# Patient Record
Sex: Female | Born: 2005 | Race: White | Hispanic: No | Marital: Single | Smoking: Never smoker
Health system: Southern US, Community
[De-identification: ages and names within clinical notes are randomized; demographics above are authoritative.]

## PROBLEM LIST (undated history)

## (undated) DIAGNOSIS — J302 Other seasonal allergic rhinitis: Secondary | ICD-10-CM

## (undated) DIAGNOSIS — F5000 Anorexia nervosa, unspecified: Secondary | ICD-10-CM

## (undated) DIAGNOSIS — F32A Depression, unspecified: Secondary | ICD-10-CM

## (undated) DIAGNOSIS — F419 Anxiety disorder, unspecified: Secondary | ICD-10-CM

## (undated) DIAGNOSIS — F952 Tourette's disorder: Secondary | ICD-10-CM

## (undated) DIAGNOSIS — F445 Conversion disorder with seizures or convulsions: Secondary | ICD-10-CM

## (undated) DIAGNOSIS — F4310 Post-traumatic stress disorder, unspecified: Secondary | ICD-10-CM

## (undated) HISTORY — PX: TOE SURGERY: SHX1073

## (undated) HISTORY — DX: Anorexia nervosa, unspecified: F50.00

## (undated) HISTORY — DX: Anxiety disorder, unspecified: F41.9

## (undated) HISTORY — DX: Conversion disorder with seizures or convulsions: F44.5

## (undated) HISTORY — DX: Post-traumatic stress disorder, unspecified: F43.10

## (undated) HISTORY — DX: Depression, unspecified: F32.A

---
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
## 2024-01-04 DIAGNOSIS — F445 Conversion disorder with seizures or convulsions: Secondary | ICD-10-CM | POA: Diagnosis not present

## 2024-01-04 DIAGNOSIS — F4310 Post-traumatic stress disorder, unspecified: Secondary | ICD-10-CM | POA: Diagnosis not present

## 2024-01-10 ENCOUNTER — Encounter: Payer: Medicaid Other | Attending: Physical Medicine & Rehabilitation | Admitting: Physical Medicine & Rehabilitation

## 2024-01-10 DIAGNOSIS — F332 Major depressive disorder, recurrent severe without psychotic features: Secondary | ICD-10-CM | POA: Insufficient documentation

## 2024-01-10 DIAGNOSIS — M357 Hypermobility syndrome: Secondary | ICD-10-CM | POA: Insufficient documentation

## 2024-01-10 DIAGNOSIS — M5441 Lumbago with sciatica, right side: Secondary | ICD-10-CM | POA: Insufficient documentation

## 2024-01-10 DIAGNOSIS — M5442 Lumbago with sciatica, left side: Secondary | ICD-10-CM | POA: Insufficient documentation

## 2024-01-10 DIAGNOSIS — G8929 Other chronic pain: Secondary | ICD-10-CM | POA: Insufficient documentation

## 2024-01-11 DIAGNOSIS — F4310 Post-traumatic stress disorder, unspecified: Secondary | ICD-10-CM | POA: Diagnosis not present

## 2024-01-11 DIAGNOSIS — F445 Conversion disorder with seizures or convulsions: Secondary | ICD-10-CM | POA: Diagnosis not present
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
## 2024-01-12 ENCOUNTER — Encounter (HOSPITAL_COMMUNITY): Payer: Self-pay | Admitting: Psychiatry

## 2024-01-12 ENCOUNTER — Ambulatory Visit (HOSPITAL_COMMUNITY): Payer: Medicaid Other | Admitting: Psychiatry

## 2024-01-12 DIAGNOSIS — F4310 Post-traumatic stress disorder, unspecified: Secondary | ICD-10-CM

## 2024-01-12 DIAGNOSIS — F50014 Anorexia nervosa, restricting type, in remission: Secondary | ICD-10-CM

## 2024-01-12 DIAGNOSIS — F339 Major depressive disorder, recurrent, unspecified: Secondary | ICD-10-CM

## 2024-01-12 DIAGNOSIS — F411 Generalized anxiety disorder: Secondary | ICD-10-CM | POA: Diagnosis not present

## 2024-01-12 MED ORDER — DULOXETINE HCL 60 MG PO CPEP: 60.0000 mg | ORAL_CAPSULE | Freq: Every day | ORAL | 2 refills | Status: DC

## 2024-01-12 MED ORDER — PROPRANOLOL HCL ER 60 MG PO CP24: 60.0000 mg | ORAL_CAPSULE | Freq: Every day | ORAL | 2 refills | Status: DC

## 2024-01-12 NOTE — Patient Instructions (Signed)

## 2024-01-18 DIAGNOSIS — F445 Conversion disorder with seizures or convulsions: Secondary | ICD-10-CM | POA: Diagnosis not present

## 2024-01-18 DIAGNOSIS — F4310 Post-traumatic stress disorder, unspecified: Secondary | ICD-10-CM | POA: Diagnosis not present

## 2024-01-25 DIAGNOSIS — F445 Conversion disorder with seizures or convulsions: Secondary | ICD-10-CM | POA: Diagnosis not present

## 2024-01-25 DIAGNOSIS — F4310 Post-traumatic stress disorder, unspecified: Secondary | ICD-10-CM | POA: Diagnosis not present

## 2024-02-01 DIAGNOSIS — F4310 Post-traumatic stress disorder, unspecified: Secondary | ICD-10-CM | POA: Diagnosis not present

## 2024-02-01 DIAGNOSIS — F445 Conversion disorder with seizures or convulsions: Secondary | ICD-10-CM | POA: Diagnosis not present

## 2024-02-08 DIAGNOSIS — F4310 Post-traumatic stress disorder, unspecified: Secondary | ICD-10-CM | POA: Diagnosis not present

## 2024-02-08 DIAGNOSIS — F445 Conversion disorder with seizures or convulsions: Secondary | ICD-10-CM | POA: Diagnosis not present

## 2024-02-15 DIAGNOSIS — F445 Conversion disorder with seizures or convulsions: Secondary | ICD-10-CM | POA: Diagnosis not present

## 2024-02-15 DIAGNOSIS — F4310 Post-traumatic stress disorder, unspecified: Secondary | ICD-10-CM | POA: Diagnosis not present
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
## 2024-02-22 DIAGNOSIS — F445 Conversion disorder with seizures or convulsions: Secondary | ICD-10-CM | POA: Diagnosis not present

## 2024-02-22 DIAGNOSIS — F4310 Post-traumatic stress disorder, unspecified: Secondary | ICD-10-CM | POA: Diagnosis not present

## 2024-02-23 ENCOUNTER — Telehealth (HOSPITAL_COMMUNITY): Payer: Medicaid Other | Admitting: Psychiatry

## 2024-02-23 ENCOUNTER — Encounter (HOSPITAL_COMMUNITY): Payer: Self-pay | Admitting: Psychiatry

## 2024-02-23 DIAGNOSIS — F339 Major depressive disorder, recurrent, unspecified: Secondary | ICD-10-CM | POA: Diagnosis not present

## 2024-02-23 DIAGNOSIS — F50014 Anorexia nervosa, restricting type, in remission: Secondary | ICD-10-CM

## 2024-02-23 DIAGNOSIS — F411 Generalized anxiety disorder: Secondary | ICD-10-CM | POA: Diagnosis not present

## 2024-02-23 DIAGNOSIS — R41840 Attention and concentration deficit: Secondary | ICD-10-CM

## 2024-02-23 MED ORDER — PROPRANOLOL HCL ER 60 MG PO CP24: 60.0000 mg | ORAL_CAPSULE | Freq: Every day | ORAL | 2 refills | Status: DC

## 2024-02-23 MED ORDER — DULOXETINE HCL 60 MG PO CPEP: 60.0000 mg | ORAL_CAPSULE | Freq: Every day | ORAL | 2 refills | Status: DC

## 2024-02-23 NOTE — Patient Instructions (Signed)

## 2024-02-29 DIAGNOSIS — F4310 Post-traumatic stress disorder, unspecified: Secondary | ICD-10-CM | POA: Diagnosis not present

## 2024-02-29 DIAGNOSIS — F445 Conversion disorder with seizures or convulsions: Secondary | ICD-10-CM | POA: Diagnosis not present

## 2024-03-06 ENCOUNTER — Other Ambulatory Visit (HOSPITAL_COMMUNITY)

## 2024-03-07 DIAGNOSIS — F4310 Post-traumatic stress disorder, unspecified: Secondary | ICD-10-CM | POA: Diagnosis not present

## 2024-03-07 DIAGNOSIS — F445 Conversion disorder with seizures or convulsions: Secondary | ICD-10-CM | POA: Diagnosis not present

## 2024-03-13 ENCOUNTER — Other Ambulatory Visit (INDEPENDENT_AMBULATORY_CARE_PROVIDER_SITE_OTHER)

## 2024-03-13 DIAGNOSIS — R41840 Attention and concentration deficit: Secondary | ICD-10-CM | POA: Diagnosis not present

## 2024-03-13 DIAGNOSIS — Z79899 Other long term (current) drug therapy: Secondary | ICD-10-CM | POA: Diagnosis not present

## 2024-03-13 DIAGNOSIS — F411 Generalized anxiety disorder: Secondary | ICD-10-CM

## 2024-03-13 DIAGNOSIS — F50014 Anorexia nervosa, restricting type, in remission: Secondary | ICD-10-CM

## 2024-03-13 DIAGNOSIS — F339 Major depressive disorder, recurrent, unspecified: Secondary | ICD-10-CM

## 2024-03-13 NOTE — Progress Notes (Signed)
 Pt tolerated lab draws well in right arm with no complaints.   JNL, CMA

## 2024-03-14 DIAGNOSIS — F4310 Post-traumatic stress disorder, unspecified: Secondary | ICD-10-CM | POA: Diagnosis not present

## 2024-03-14 DIAGNOSIS — F445 Conversion disorder with seizures or convulsions: Secondary | ICD-10-CM | POA: Diagnosis not present

## 2024-03-15 LAB — CBC WITH DIFFERENTIAL/PLATELET
Basophils Absolute: 0 10*3/uL (ref 0.0–0.2)
Basos: 0 %
EOS (ABSOLUTE): 0.1 10*3/uL (ref 0.0–0.4)
Eos: 2 %
Hematocrit: 37.7 % (ref 34.0–46.6)
Hemoglobin: 12.2 g/dL (ref 11.1–15.9)
Immature Grans (Abs): 0 10*3/uL (ref 0.0–0.1)
Immature Granulocytes: 0 %
Lymphocytes Absolute: 2.2 10*3/uL (ref 0.7–3.1)
Lymphs: 26 %
MCH: 28.4 pg (ref 26.6–33.0)
MCHC: 32.4 g/dL (ref 31.5–35.7)
MCV: 88 fL (ref 79–97)
Monocytes Absolute: 0.5 10*3/uL (ref 0.1–0.9)
Monocytes: 6 %
Neutrophils Absolute: 5.7 10*3/uL (ref 1.4–7.0)
Neutrophils: 66 %
Platelets: 367 10*3/uL (ref 150–450)
RBC: 4.29 x10E6/uL (ref 3.77–5.28)
RDW: 13.1 % (ref 11.7–15.4)
WBC: 8.6 10*3/uL (ref 3.4–10.8)

## 2024-03-15 LAB — VITAMIN D 25 HYDROXY (VIT D DEFICIENCY, FRACTURES): Vit D, 25-Hydroxy: 18.8 ng/mL — ABNORMAL LOW (ref 30.0–100.0)

## 2024-03-15 LAB — COMPREHENSIVE METABOLIC PANEL
ALT: 8 IU/L (ref 0–32)
AST: 12 IU/L (ref 0–40)
Albumin: 4.3 g/dL (ref 4.0–5.0)
Alkaline Phosphatase: 90 IU/L (ref 42–106)
BUN/Creatinine Ratio: 29 — ABNORMAL HIGH (ref 9–23)
BUN: 16 mg/dL (ref 6–20)
Bilirubin Total: <0.2 mg/dL (ref 0.0–1.2)
CO2: 18 mmol/L — ABNORMAL LOW (ref 20–29)
Calcium: 9.5 mg/dL (ref 8.7–10.2)
Chloride: 104 mmol/L (ref 96–106)
Creatinine, Ser: 0.55 mg/dL — ABNORMAL LOW (ref 0.57–1.00)
Globulin, Total: 2.7 g/dL (ref 1.5–4.5)
Glucose: 95 mg/dL (ref 70–99)
Potassium: 4.4 mmol/L (ref 3.5–5.2)
Sodium: 138 mmol/L (ref 134–144)
Total Protein: 7 g/dL (ref 6.0–8.5)
eGFR: 136 mL/min/{1.73_m2} (ref >59)

## 2024-03-15 LAB — PHOSPHORUS: Phosphorus: 4.3 mg/dL (ref 3.3–5.1)

## 2024-03-15 LAB — MAGNESIUM: Magnesium: 1.9 mg/dL (ref 1.6–2.3)

## 2024-03-15 LAB — TSH: TSH: 2.41 u[IU]/mL (ref 0.450–4.500)

## 2024-03-15 LAB — VITAMIN B12: Vitamin B-12: 385 pg/mL (ref 232–1245)

## 2024-03-17 ENCOUNTER — Encounter (HOSPITAL_COMMUNITY): Payer: Self-pay

## 2024-03-17 ENCOUNTER — Encounter: Payer: Medicaid Other | Attending: Physical Medicine & Rehabilitation | Admitting: Physical Medicine & Rehabilitation

## 2024-03-17 ENCOUNTER — Encounter: Payer: Self-pay | Admitting: Physical Medicine & Rehabilitation

## 2024-03-17 VITALS — BP 114/79 | HR 85 | Wt 168.6 lb

## 2024-03-17 DIAGNOSIS — M357 Hypermobility syndrome: Secondary | ICD-10-CM

## 2024-03-17 DIAGNOSIS — M5441 Lumbago with sciatica, right side: Secondary | ICD-10-CM | POA: Insufficient documentation

## 2024-03-17 DIAGNOSIS — F332 Major depressive disorder, recurrent severe without psychotic features: Secondary | ICD-10-CM | POA: Diagnosis not present

## 2024-03-17 DIAGNOSIS — G8929 Other chronic pain: Secondary | ICD-10-CM

## 2024-03-17 DIAGNOSIS — M5442 Lumbago with sciatica, left side: Secondary | ICD-10-CM | POA: Diagnosis not present

## 2024-03-17 NOTE — Progress Notes (Signed)
 Subjective:    Patient ID: Rhonda [name], female    DOB: April 13, 2006, 18 y.o.   MRN: [id number]  HPI   HPI  Patient is here with her mother today  Rhonda [name] is a 18 y.o. year old female  who  has a past medical history of Anorexia nervosa, Anxiety, Depression, Psychogenic nonepileptic seizure, PTSD (post-traumatic stress disorder), Seasonal allergies, and Tourette's.   They are presenting to PM[facility] clinic as a new patient for pain management evaluation. They were referred by Alfredia Ferguson for treatment of body wide pain.  Patient reports that her pain started about 3 to 4 years ago after she developed Tourette's and seizures.  It appears that seizures are felt to be nonepileptic.  She has reported pain throughout her whole body.  Pain appears to be worse in her neck, lower back, shoulders.  Pain will shoot down her legs.  Standing for a long time worsens her pain significantly.  She feels like she cannot do anything due to the pain.  If she tries to do any kind of strenuous activity she has to sit and take a break halfway through.  She feels that her pain has a tingling, nerve type sensation.  Patient has hypomobility, not diagnosed with Ehlers-Danlos but is interested in getting tested for this condition.  She is now on max dose gabapentin and she feels like this is what is helping her pain best.  She also does stretching and heat that can be helpful.  Patient reports poor sleep at night, she is tired today due to this.  When she does any kind of significant activity she is often sore for the next few days.  She has a history of depression and ADHD.   Red flag symptoms: No red flags for back pain endorsed in Hx or ROS  Medications tried: Topical medications  solonpas, icyhot- dont help  Nsaids - do nothing  Tylenol  - doesn't do anything  Gabapentin- Its great, she in on max  TCAs- denies  SNRIs  She takes duloxetine  Heating pad helps   Other treatments: PT- 2 different places,  didn't help  TENs unit - Has not tried  Injections - Denies  Surgery Denies   Interval history 03/17/2024 Patient continues to have pain in multiple areas of her body, reported to be unchanged from prior visit.  She reports pain is worse recently and her neck, back, and legs.  Not affecting the arms quite as much.  She tried low-dose naltrexone 1.5 mg for several weeks, denies benefit.  Discussed that we would ideally titrate medication to see if other doses might be helpful.  She continues to take gabapentin 1600 mg twice daily.  Discussed this is typically above maximum for single dose.  We discussed trying Lyrica, patient and mother initially agreed but then decided against this medication.  They had heard from someone working with pharmacy that this does not cause sedation.  Discussed that this medication can also cause sedation like gabapentin, however it is possible she may be able to tolerate it better.  It may also be more helpful for her pain.  We would have to trial a medication to know for sure.  They report obtaining TENS unit but have not tried it yet.  Activity level continues to be very low.  Patient's mother asked about testing that would specifically diagnose fibromyalgia, discussed there is not specific test at this condition.  L-spine x-ray still pending. She would like to see genetics  for testing for EDS.    Prior UDS results:     Component Value Date/Time   LABOPIA NONE DETECTED 12/20/2021 1708   COCAINSCRNUR NONE DETECTED 12/20/2021 1708   LABBENZ NONE DETECTED 12/20/2021 1708   AMPHETMU NONE DETECTED 12/20/2021 1708   THCU NONE DETECTED 12/20/2021 1708   LABBARB NONE DETECTED 12/20/2021 1708       Pain Inventory Average Pain 7 Pain Right Now 2 My pain is constant and aching  LOCATION OF PAIN  Neck, Shoulder, Knees & toes, both legs  BOWEL Number of stools per week: 7   BLADDER Normal    Mobility walk without assistance ability to climb steps?  yes do you  drive?  no Do you have any goals in this area?  yes  Function not employed: date last employed Not employed   Neuro/Psych depression  Prior Studies Any changes since last visit?  no  Physicians involved in your care Any changes since last visit?  no   Family History  Problem Relation Age of Onset   Anxiety disorder Mother    Eating disorder Mother    Asthma Father    Cancer Maternal Grandmother    COPD Maternal Grandmother    Migraines Neg Hx    Seizures Neg Hx    Depression Neg Hx    Schizophrenia Neg Hx    ADD / ADHD Neg Hx    Autism Neg Hx    Social History   Socioeconomic History   Marital status: Single    Spouse name: Not on file   Number of children: Not on file   Years of education: Not on file   Highest education level: Not on file  Occupational History   Not on file  Tobacco Use   Smoking status: Never    Passive exposure: Never   Smokeless tobacco: Never  Vaping Use   Vaping status: Never Used  Substance and Sexual Activity   Alcohol use: No   Drug use: Never   Sexual activity: Never  Other Topics Concern   Not on file  Social History Narrative   Rhonda [name] graduated form [employer].    She works as a TEFL teacher currently.    She lives with her parents and sibling and dog.    Rose enjoys reading, listening to music, snuggling with dog ([name]), drawing and singing.    Social Drivers of Corporate investment banker Strain: Not on file  Food Insecurity: Not on file  Transportation Needs: Not on file  Physical Activity: Not on file  Stress: Not on file  Social Connections: Not on file   Past Surgical History:  Procedure Laterality Date   TOE SURGERY Bilateral    Past Medical History:  Diagnosis Date   Anorexia nervosa    Anxiety    [employer] 06/11/2020   Depression    [employer] 06/11/2020   Psychogenic nonepileptic seizure    PTSD (post-traumatic stress disorder)    Seasonal allergies    Tourette's    There were no vitals taken for this  visit.  Opioid Risk Score:   Fall Risk Score:  `1  Depression screen PHQ 2/9     03/17/2024    1:09 PM 12/02/2023    1:41 PM 10/27/2023    3:43 PM 12/20/2021    9:38 PM 04/29/2020    9:00 AM 03/01/2020    1:44 PM 02/03/2020   11:18 AM  Depression screen PHQ 2/9  Decreased Interest 3 3 3 3  3  3 3  Down, Depressed, Hopeless 0 3 1 3 3 3 3   PHQ - 2 Score 3 6 4 6 6 6 6   Altered sleeping 2 3 3 3 3 3 3   Tired, decreased energy 3 3 3 3 3 3 3   Change in appetite 0 3 3 2 1  0 2  Feeling bad or failure about yourself  3 3 3 3 3 3 3   Trouble concentrating 0 1 2 3 3 3 3   Moving slowly or fidgety/restless 0 0 3 2 1 2 3   Suicidal thoughts 0 0 2 1     PHQ-9 Score 11 19 23 23 20 20 23   Difficult doing work/chores   Extremely dIfficult Extremely dIfficult       Review of Systems  Musculoskeletal:  Positive for arthralgias, back pain and neck pain.  Psychiatric/Behavioral:         Depression  All other systems reviewed and are negative.      Objective:   Physical Exam   Gen: no distress, normal appearing HEENT: oral mucosa pink and moist, NCAT Chest: normal effort, normal rate of breathing Abd: soft, non-distended Ext: no edema Psych: pleasant, normal affect Skin: intact Neuro: Alert and awake, follows commands, cranial nerves II through XII grossly intact, normal speech and language RUE: 5/5 Deltoid, 5/5 Biceps, 5/5 Triceps, 5/5 Wrist Ext, 5/5 Grip LUE: 5/5 Deltoid, 5/5 Biceps, 5/5 Triceps, 5/5 Wrist Ext, 5/5 Grip RLE: HF 5/5, KE 5/5, ADF 5/5, APF 5/5 LLE: HF 5/5, KE 5/5, ADF 5/5, APF 5/5 Sensory exam normal for light touch and pain in all 4 limbs. No limb ataxia or cerebellar signs. No abnormal tone appreciated.  No abnormal tone noted Musculoskeletal:  TTP, C-spine, T-spine, L-spine TTP parascapular muscles TTP shoulders and elbows TTP left knee and right ankle Minimal TTP bilateral Hypomobility noted several joints Prior exam  TTP paraspinal muscles b/l TTP C spine, T  spine and upper L spine She reports band of pain lower L spine  SLR negative b/l  FABER- hip pain b/l FADIR neg Spurlings test negative  Hypermobility/hyperextension at b/l knees, elbows, shoulders Able to touch thumbs to wrists B/L shoulder TTP B/L hip TTP  L shoulder TTP Able to put her hands flat down on the ground bending forward  X-ray C-spine 08/04/2021  Straightening of the cervical spine. Vertebral body heights and disc spaces appear normal. Dens and lateral masses are unremarkable   IMPRESSION: Straightening of the cervical spine.  Otherwise negative         Assessment & Plan:   1) Chronic lower back pain -She does reports shooting pain down her b/l legs 2) Chronic neck pain 3) Hypermobility syndrome  4) Depression/ADHD -No SI or HI  5)Polyarthralgia  -Suspect fibromyalgia as she has widespread pain, history of depression, insomnia 6) Insomnia   -Continue Cymbalta current dose -PT would like to remain on gabapentin, consider adjusting to TID at lower dose -Increase low dose naltexone to 3.0 mg daily, #[facility]. Discussed this medication may require continued titration -Consider trying lyrica but would need to discontinue gabapentin.  Patient declines -Low impact gradual increased activity discussed -Recommend Yoga/Tai chi -Pool therapy may be limited by psychogenic seizures -Discussed foods for pain, including tumeric/tart cherry juice  -Advised TENS, ordered Zynex Nexwave - pt has device has not tried yet -X-ray of lumbar spine ordered last visit- pending. Advised to complete -Will refer to Sports Medicine. A lot of my prior patients have reported  benefit from seeing Dr. Katrinka Blazing.  -Refer to genetics to eval for EDS

## 2024-03-17 NOTE — Progress Notes (Signed)
 Pain Inventory Average Pain 5 Pain Right Now 2 My pain is constant and aching  In the last 24 hours, has pain interfered with the following? General activity 9 Relation with others 7 Enjoyment of life 6 What TIME of day is your pain at its worst? morning , daytime, evening, and night Sleep (in general) Poor  Pain is worse with: walking, bending, and standing Pain improves with: rest and medication Relief from Meds: 7  Family History  Problem Relation Age of Onset   Anxiety disorder Mother    Eating disorder Mother    Asthma Father    Cancer Maternal Grandmother    COPD Maternal Grandmother    Migraines Neg Hx    Seizures Neg Hx    Depression Neg Hx    Schizophrenia Neg Hx    ADD / ADHD Neg Hx    Autism Neg Hx    Social History   Socioeconomic History   Marital status: Single    Spouse name: Not on file   Number of children: Not on file   Years of education: Not on file   Highest education level: Not on file  Occupational History   Not on file  Tobacco Use   Smoking status: Never    Passive exposure: Never   Smokeless tobacco: Never  Vaping Use   Vaping status: Never Used  Substance and Sexual Activity   Alcohol use: No   Drug use: Never   Sexual activity: Never  Other Topics Concern   Not on file  Social History Narrative   Rhonda [name] graduated form [employer].    She works as a TEFL teacher currently.    She lives with her parents and sibling and dog.    Rainn enjoys reading, listening to music, snuggling with dog ([name]), drawing and singing.    Social Drivers of Corporate investment banker Strain: Not on file  Food Insecurity: Not on file  Transportation Needs: Not on file  Physical Activity: Not on file  Stress: Not on file  Social Connections: Not on file   Past Surgical History:  Procedure Laterality Date   TOE SURGERY Bilateral    Past Surgical History:  Procedure Laterality Date   TOE SURGERY Bilateral    Past Medical History:  Diagnosis Date    Anorexia nervosa    Anxiety    [employer]ia 06/11/2020   Depression    [employer] 06/11/2020   Psychogenic nonepileptic seizure    PTSD (post-traumatic stress disorder)    Seasonal allergies    Tourette's    There were no vitals taken for this visit.  Opioid Risk Score:   Fall Risk Score:  `1  Depression screen [facility] 2/9     03/17/2024    1:09 PM 12/02/2023    1:41 PM 10/27/2023    3:43 PM 12/20/2021    9:38 PM 04/29/2020    9:00 AM 03/01/2020    1:44 PM 02/03/2020   11:18 AM  Depression screen PHQ 2/9  Decreased Interest 3 3 3 3 3 3 3   Down, Depressed, Hopeless 0 3 1 3 3 3 3   PHQ - 2 Score 3 6 4 6 6 6 6   Altered sleeping 2 3 3 3 3 3 3   Tired, decreased energy 3 3 3 3 3 3 3   Change in appetite 0 3 3 2 1  0 2  Feeling bad or failure about yourself  3 3 3 3 3 3 3   Trouble concentrating 0 1 2  3 3 3 3   Moving slowly or fidgety/restless 0 0 3 2 1 2 3   Suicidal thoughts 0 0 2 1     PHQ-9 Score 11 19 23 23 20 20 23   Difficult doing work/chores   Extremely dIfficult Extremely dIfficult

## 2024-03-21 DIAGNOSIS — F445 Conversion disorder with seizures or convulsions: Secondary | ICD-10-CM | POA: Diagnosis not present

## 2024-03-21 DIAGNOSIS — F4310 Post-traumatic stress disorder, unspecified: Secondary | ICD-10-CM | POA: Diagnosis not present

## 2024-03-28 DIAGNOSIS — F445 Conversion disorder with seizures or convulsions: Secondary | ICD-10-CM | POA: Diagnosis not present

## 2024-03-28 DIAGNOSIS — F4310 Post-traumatic stress disorder, unspecified: Secondary | ICD-10-CM | POA: Diagnosis not present

## 2024-04-04 DIAGNOSIS — F445 Conversion disorder with seizures or convulsions: Secondary | ICD-10-CM | POA: Diagnosis not present

## 2024-04-04 DIAGNOSIS — F4310 Post-traumatic stress disorder, unspecified: Secondary | ICD-10-CM | POA: Diagnosis not present

## 2024-04-11 DIAGNOSIS — F445 Conversion disorder with seizures or convulsions: Secondary | ICD-10-CM | POA: Diagnosis not present

## 2024-04-11 DIAGNOSIS — F4310 Post-traumatic stress disorder, unspecified: Secondary | ICD-10-CM | POA: Diagnosis not present

## 2024-04-18 DIAGNOSIS — F445 Conversion disorder with seizures or convulsions: Secondary | ICD-10-CM | POA: Diagnosis not present

## 2024-04-18 DIAGNOSIS — F4310 Post-traumatic stress disorder, unspecified: Secondary | ICD-10-CM | POA: Diagnosis not present
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
## 2024-04-19 ENCOUNTER — Telehealth (HOSPITAL_COMMUNITY): Admitting: Psychiatry

## 2024-04-19 ENCOUNTER — Encounter (HOSPITAL_COMMUNITY): Payer: Self-pay | Admitting: Psychiatry

## 2024-04-19 DIAGNOSIS — F50014 Anorexia nervosa, restricting type, in remission: Secondary | ICD-10-CM | POA: Diagnosis not present

## 2024-04-19 DIAGNOSIS — F411 Generalized anxiety disorder: Secondary | ICD-10-CM | POA: Diagnosis not present

## 2024-04-19 DIAGNOSIS — F4310 Post-traumatic stress disorder, unspecified: Secondary | ICD-10-CM | POA: Diagnosis not present

## 2024-04-19 DIAGNOSIS — E559 Vitamin D deficiency, unspecified: Secondary | ICD-10-CM

## 2024-04-19 DIAGNOSIS — F3342 Major depressive disorder, recurrent, in full remission: Secondary | ICD-10-CM

## 2024-04-19 DIAGNOSIS — F900 Attention-deficit hyperactivity disorder, predominantly inattentive type: Secondary | ICD-10-CM

## 2024-04-19 DIAGNOSIS — G2569 Other tics of organic origin: Secondary | ICD-10-CM

## 2024-04-19 DIAGNOSIS — F445 Conversion disorder with seizures or convulsions: Secondary | ICD-10-CM

## 2024-04-19 MED ORDER — CLONIDINE HCL ER 0.1 MG PO TB12: 0.1000 mg | ORAL_TABLET | Freq: Every day | ORAL | 2 refills | Status: DC

## 2024-04-19 MED ORDER — DULOXETINE HCL 60 MG PO CPEP: 60.0000 mg | ORAL_CAPSULE | Freq: Every day | ORAL | 2 refills | Status: DC

## 2024-04-19 MED ORDER — VITAMIN D (CHOLECALCIFEROL) 25 MCG (1000 UT) PO CAPS: 1000.0000 [IU] | ORAL_CAPSULE | Freq: Every day | ORAL | Status: DC

## 2024-04-19 MED ORDER — PROPRANOLOL HCL ER 60 MG PO CP24: 60.0000 mg | ORAL_CAPSULE | Freq: Every day | ORAL | 2 refills | Status: DC

## 2024-04-19 NOTE — Patient Instructions (Addendum)
 Thank you for attending your appointment today.  -- START clonidine 0.1 mg nightly -- Continue other medications as prescribed. -- Here are some helpful behavioral strategies and techniques for managing your ADHD: - Set an alarm in the morning to remind you to take your medications and arrange your medications in a pill box. - Break studying up into manageable chunks using the Pomodoro technique: Set a timer (not the one on your phone) for 20-25 minutes of focused, uninterrupted studying and work until the timer stops. When the timer stops, take a short 5 minute break. Repeat this and once you've completed 4 cycles, give yourself a longer break of 20-30 minutes. -- An online timer can be found here: https://pomofocus.io/ - Use a planner or calendar to organize activities and keep track of future events. - For videos about strategies for living with ADHD, go to http://harris-peterson.info/   Please do not make any changes to medications without first discussing with your provider. If you are experiencing a psychiatric emergency, please call 911 or present to your nearest emergency department. Additional crisis, medication management, and therapy resources are included below.  [facility]  [address], [place], [place] [address] [phone] WALK-IN URGENT CARE 24/7 FOR ANYONE [address] [place], [place]  [phone] Fax: [phone] [email or web address] *Interpreters available *Accepts all insurance and uninsured for Urgent Care needs *Accepts Medicaid and uninsured for outpatient treatment (below)      ONLY FOR [facility]  Below:    Outpatient New Patient Assessment/Therapy Walk-ins:        Monday, Wednesday, and Thursday 8am until slots are full (first come, first served)                   New Patient Psychiatry/Medication Management        Monday-Friday 8am-11am (first come, first served)               For all walk-ins we ask that  you arrive by 7:15am, because patients will be seen in the order of arrival.

## 2024-04-25 DIAGNOSIS — F4310 Post-traumatic stress disorder, unspecified: Secondary | ICD-10-CM | POA: Diagnosis not present

## 2024-04-25 DIAGNOSIS — F445 Conversion disorder with seizures or convulsions: Secondary | ICD-10-CM | POA: Diagnosis not present

## 2024-05-02 DIAGNOSIS — F4310 Post-traumatic stress disorder, unspecified: Secondary | ICD-10-CM | POA: Diagnosis not present

## 2024-05-02 DIAGNOSIS — F445 Conversion disorder with seizures or convulsions: Secondary | ICD-10-CM | POA: Diagnosis not present

## 2024-05-09 DIAGNOSIS — F445 Conversion disorder with seizures or convulsions: Secondary | ICD-10-CM | POA: Diagnosis not present

## 2024-05-09 DIAGNOSIS — F4310 Post-traumatic stress disorder, unspecified: Secondary | ICD-10-CM | POA: Diagnosis not present

## 2024-05-16 ENCOUNTER — Telehealth: Payer: Self-pay

## 2024-05-16 ENCOUNTER — Telehealth: Payer: Self-pay | Admitting: Physical Medicine & Rehabilitation

## 2024-05-16 ENCOUNTER — Telehealth (HOSPITAL_COMMUNITY): Payer: Self-pay | Admitting: *Deleted

## 2024-05-16 ENCOUNTER — Encounter: Payer: Self-pay | Admitting: Physical Medicine & Rehabilitation

## 2024-05-16 DIAGNOSIS — F445 Conversion disorder with seizures or convulsions: Secondary | ICD-10-CM | POA: Diagnosis not present

## 2024-05-16 DIAGNOSIS — F4310 Post-traumatic stress disorder, unspecified: Secondary | ICD-10-CM | POA: Diagnosis not present

## 2024-05-16 NOTE — Telephone Encounter (Signed)
 Pt. Called in asking for Gabapentin  800 mg but you have never prescribed it for her. Please advise. She was last seen 03/17/2024

## 2024-05-16 NOTE — Telephone Encounter (Signed)
 Patients mom called in to schedule follow up , scheduled 7/24 - patient is completely out of gabapentin  (NEURONTIN ) 800 MG tablet , states she did not received rx since last visit and has now ran out of medication . Pharmacy used is [facility] [name], and would like a call back once sent

## 2024-05-16 NOTE — Telephone Encounter (Signed)
 Patient called left a voicemail asking for a refill of Gabapentin . Next appointment in July.

## 2024-05-17 MED ORDER — GABAPENTIN 800 MG PO TABS: 800.0000 mg | ORAL_TABLET | Freq: Four times a day (QID) | ORAL | 3 refills | Status: DC

## 2024-05-17 MED ORDER — GABAPENTIN 800 MG PO TABS: 800.0000 mg | ORAL_TABLET | Freq: Two times a day (BID) | ORAL | 3 refills | Status: DC

## 2024-05-18 ENCOUNTER — Telehealth: Payer: Self-pay

## 2024-05-18 NOTE — Telephone Encounter (Signed)
 Follow up call to mother of the patient re: voicemail left on 05/17/24 medication refill for gabapentin .  Mother states rx was filled and picked up at the pharmacy.

## 2024-05-18 NOTE — Telephone Encounter (Signed)
 Called to let the pt. Know that her gabapentin  was sent in, per dr. Estill Hemming

## 2024-05-23 DIAGNOSIS — F4310 Post-traumatic stress disorder, unspecified: Secondary | ICD-10-CM | POA: Diagnosis not present

## 2024-05-23 DIAGNOSIS — F445 Conversion disorder with seizures or convulsions: Secondary | ICD-10-CM | POA: Diagnosis not present

## 2024-05-30 ENCOUNTER — Ambulatory Visit (INDEPENDENT_AMBULATORY_CARE_PROVIDER_SITE_OTHER): Admitting: Physician Assistant

## 2024-05-30 ENCOUNTER — Encounter: Payer: Self-pay | Admitting: Physician Assistant

## 2024-05-30 VITALS — BP 100/68 | HR 88 | Ht 66.0 in | Wt 164.2 lb

## 2024-05-30 DIAGNOSIS — M357 Hypermobility syndrome: Secondary | ICD-10-CM | POA: Diagnosis not present

## 2024-05-30 DIAGNOSIS — F4310 Post-traumatic stress disorder, unspecified: Secondary | ICD-10-CM | POA: Diagnosis not present

## 2024-05-30 DIAGNOSIS — F445 Conversion disorder with seizures or convulsions: Secondary | ICD-10-CM

## 2024-05-30 NOTE — Progress Notes (Signed)
 Established patient visit   Patient: Rhonda [name]   DOB: Apr 27, 2006   18 y.o. Female  MRN: [id number] Visit Date: 05/30/2024  Today's healthcare provider: Trenton Frock, PA-C   Cc. Follow up   Subjective     Discussed the use of AI scribe software for clinical note transcription with the patient, who gave verbal consent to proceed.  History of Present Illness   Rhonda [name] is a 18 year old female with a history of psychogenic nonepileptic seizures and chronic pain who presents with a recent seizure and concerns about pain management. She is accompanied by her mother.  She experienced a seizure two weeks ago after running out of gabapentin  over a holiday weekend, resulting in a delay in obtaining her medication. During this period, she experienced significant anxiety and jitteriness.  Her mother is concerned about her future, including her ability to obtain a driver's license and employment, given her medical conditions. She is considering genetic testing for conditions like Ehlers-Danlos syndrome and discussing the possibility of fibromyalgia.  She modifies her activities to manage her condition, such as using a chair for tasks to avoid exacerbating pain. She attends the gym, finding that any pain is manageable and resolves by the next day.      Medications: Outpatient Medications Prior to Visit  Medication Sig   albuterol (PROVENTIL HFA;VENTOLIN HFA) 108 (90 Base) MCG/ACT inhaler Inhale 2 puffs into the lungs every 6 (six) hours as needed for wheezing or shortness of breath.   AMBULATORY NON FORMULARY MEDICATION Take 1.5 mg by mouth daily. Medication Name: naltrexone   cloNIDine  HCl (KAPVAY ) 0.1 MG TB12 ER tablet Take 1 tablet (0.1 mg total) by mouth at bedtime. (Patient not taking: Reported on 05/30/2024)   DULoxetine  (CYMBALTA ) 60 MG capsule Take 1 capsule (60 mg total) by mouth daily.   fluticasone (FLONASE) 50 MCG/ACT nasal spray Place 1 spray into both nostrils daily as  needed for allergies or rhinitis.   gabapentin  (NEURONTIN ) 800 MG tablet Take 1 tablet (800 mg total) by mouth 4 (four) times daily.   [name]  3-0.02 MG tablet TAKE 1 TABLET BY MOUTH EVERY DAY   propranolol  ER (INDERAL  LA) 60 MG 24 hr capsule Take 1 capsule (60 mg total) by mouth daily.   Vitamin D , Cholecalciferol , 25 MCG (1000 UT) CAPS Take 1,000 Units by mouth daily.   No facility-administered medications prior to visit.    Review of Systems  Constitutional:  Negative for fatigue and fever.  Respiratory:  Negative for cough and shortness of breath.   Cardiovascular:  Negative for chest pain and leg swelling.  Gastrointestinal:  Negative for abdominal pain.  Neurological:  Negative for dizziness and headaches.       Objective    BP 100/68   Pulse 88   Ht 5\' 6"  (1.676 m)   Wt 164 lb 3.2 oz (74.5 kg)   BMI 26.50 kg/m    Physical Exam Vitals reviewed.  Constitutional:      Appearance: She is not ill-appearing.  HENT:     Head: Normocephalic.  Eyes:     Conjunctiva/sclera: Conjunctivae normal.  Cardiovascular:     Rate and Rhythm: Normal rate.  Pulmonary:     Effort: Pulmonary effort is normal. No respiratory distress.  Neurological:     Mental Status: She is alert and oriented to person, place, and time.  Psychiatric:        Mood and Affect: Mood normal.  Behavior: Behavior normal.     No results found for any visits on 05/30/24.  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  Had referred to [facility] clinic but they only see children. [facility]  Orders: -     Ambulatory referral to Genetics  Psychogenic nonepileptic seizure   Alyss experienced a seizure after a long period of stability, triggered by gabapentin  withdrawal and associated stress. The goal is to stabilize seizures and pain to enable driving and employment. - Discuss seizure management and goals with psychiatrist and  therapist    Return if symptoms worsen or fail to improve.       Trenton Frock, PA-C  [facility] [phone] (phone) [phone] (fax)  [facility]

## 2024-05-30 NOTE — Assessment & Plan Note (Signed)
 4/5 beighton criteria.  Had referred to [facility] clinic but they only see children. Changing referral to [facility].

## 2024-06-06 DIAGNOSIS — F4310 Post-traumatic stress disorder, unspecified: Secondary | ICD-10-CM | POA: Diagnosis not present

## 2024-06-06 DIAGNOSIS — F445 Conversion disorder with seizures or convulsions: Secondary | ICD-10-CM | POA: Diagnosis not present

## 2024-06-13 DIAGNOSIS — F445 Conversion disorder with seizures or convulsions: Secondary | ICD-10-CM | POA: Diagnosis not present

## 2024-06-13 DIAGNOSIS — F4310 Post-traumatic stress disorder, unspecified: Secondary | ICD-10-CM | POA: Diagnosis not present

## 2024-06-16 ENCOUNTER — Telehealth: Payer: Self-pay | Admitting: *Deleted

## 2024-06-16 NOTE — Telephone Encounter (Signed)
 Copied from CRM [id number]. Topic: Referral - Status >> Jun 16, 2024 10:12 AM Ivette P wrote: Reason for CRM: Charlene called in to follow up on a referral for Genetics testing. Pt had a referral sent on 05/30/2024, per notes patient outreach attempted was made, advised Charlene that referral was sent processed.    [facility] - [address] [facility] [address] [place], [place] [address] Get Directions Hours of Operation [place] - Fri: 8 am - 5 pm Appointments [phone] [facility])   Called [facility] Scheduling and was advised by representative T that referral was received and was being reviewed by a nurse.   Ula did confirm that their location does not provide Hypermobility syndrome and referral would be denied.    Charlene is requesting that referral be sent to a different location, and would like an update to when new referral is sent and to which location.    Pls follow up with Maura (mom) with updates.   Callback [phone]
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
## 2024-06-20 DIAGNOSIS — F445 Conversion disorder with seizures or convulsions: Secondary | ICD-10-CM | POA: Diagnosis not present

## 2024-06-20 DIAGNOSIS — F4310 Post-traumatic stress disorder, unspecified: Secondary | ICD-10-CM | POA: Diagnosis not present

## 2024-06-21 ENCOUNTER — Telehealth (HOSPITAL_COMMUNITY): Admitting: Psychiatry

## 2024-06-21 ENCOUNTER — Encounter (HOSPITAL_COMMUNITY): Payer: Self-pay | Admitting: Psychiatry

## 2024-06-21 ENCOUNTER — Other Ambulatory Visit: Payer: Self-pay | Admitting: Physician Assistant

## 2024-06-21 DIAGNOSIS — F4310 Post-traumatic stress disorder, unspecified: Secondary | ICD-10-CM | POA: Diagnosis not present

## 2024-06-21 DIAGNOSIS — F3342 Major depressive disorder, recurrent, in full remission: Secondary | ICD-10-CM

## 2024-06-21 DIAGNOSIS — F900 Attention-deficit hyperactivity disorder, predominantly inattentive type: Secondary | ICD-10-CM

## 2024-06-21 DIAGNOSIS — F50014 Anorexia nervosa, restricting type, in remission: Secondary | ICD-10-CM | POA: Diagnosis not present

## 2024-06-21 DIAGNOSIS — G8929 Other chronic pain: Secondary | ICD-10-CM

## 2024-06-21 DIAGNOSIS — K5901 Slow transit constipation: Secondary | ICD-10-CM

## 2024-06-21 DIAGNOSIS — F411 Generalized anxiety disorder: Secondary | ICD-10-CM | POA: Diagnosis not present

## 2024-06-21 DIAGNOSIS — M357 Hypermobility syndrome: Secondary | ICD-10-CM

## 2024-06-21 MED ORDER — PROPRANOLOL HCL ER 60 MG PO CP24: 60.0000 mg | ORAL_CAPSULE | Freq: Every day | ORAL | 2 refills | Status: DC

## 2024-06-21 MED ORDER — ATOMOXETINE HCL 18 MG PO CAPS
18.0000 mg | ORAL_CAPSULE | Freq: Every day | ORAL | 0 refills | Status: DC
Start: 2024-06-21 — End: 2024-07-26

## 2024-06-21 MED ORDER — ATOMOXETINE HCL 40 MG PO CAPS
40.0000 mg | ORAL_CAPSULE | Freq: Every day | ORAL | 1 refills | Status: DC
Start: 2024-06-28 — End: 2024-07-26

## 2024-06-21 MED ORDER — DULOXETINE HCL 60 MG PO CPEP: 60.0000 mg | ORAL_CAPSULE | Freq: Every day | ORAL | 2 refills | Status: DC

## 2024-06-21 NOTE — Telephone Encounter (Signed)
 Pt's mom notified of new referral.

## 2024-06-21 NOTE — Patient Instructions (Signed)
 Thank you for attending your appointment today.  -- Do not start clonidine  -- START Strattera 20 mg daily for 1 week then INCREASE to 40 mg daily -- Continue other medications as prescribed.  Please do not make any changes to medications without first discussing with your provider. If you are experiencing a psychiatric emergency, please call 911 or present to your nearest emergency department. Additional crisis, medication management, and therapy resources are included below.  [facility]  [address], [place], [place] [address] [phone] WALK-IN URGENT CARE 24/7 FOR ANYONE [address], [place], [place]  [phone] Fax: [phone] [email or web address] *Interpreters available *Accepts all insurance and uninsured for Urgent Care needs *Accepts Medicaid and uninsured for outpatient treatment (below)      ONLY FOR [facility]  Below:    Outpatient New Patient Assessment/Therapy Walk-ins:        Monday, Wednesday, and Thursday 8am until slots are full (first come, first served)                   New Patient Psychiatry/Medication Management        Monday-Friday 8am-11am (first come, first served)               For all walk-ins we ask that you arrive by 7:15am, because patients will be seen in the order of arrival.

## 2024-06-27 DIAGNOSIS — F445 Conversion disorder with seizures or convulsions: Secondary | ICD-10-CM | POA: Diagnosis not present

## 2024-06-27 DIAGNOSIS — F4310 Post-traumatic stress disorder, unspecified: Secondary | ICD-10-CM | POA: Diagnosis not present

## 2024-07-04 DIAGNOSIS — F4310 Post-traumatic stress disorder, unspecified: Secondary | ICD-10-CM | POA: Diagnosis not present

## 2024-07-04 DIAGNOSIS — F445 Conversion disorder with seizures or convulsions: Secondary | ICD-10-CM | POA: Diagnosis not present

## 2024-07-11 DIAGNOSIS — F445 Conversion disorder with seizures or convulsions: Secondary | ICD-10-CM | POA: Diagnosis not present

## 2024-07-11 DIAGNOSIS — F4310 Post-traumatic stress disorder, unspecified: Secondary | ICD-10-CM | POA: Diagnosis not present

## 2024-07-13 ENCOUNTER — Encounter: Attending: Physical Medicine & Rehabilitation | Admitting: Physical Medicine & Rehabilitation

## 2024-07-13 ENCOUNTER — Ambulatory Visit: Admitting: Physical Medicine & Rehabilitation

## 2024-07-13 ENCOUNTER — Encounter: Payer: Self-pay | Admitting: Physical Medicine & Rehabilitation

## 2024-07-13 VITALS — BP 114/83 | HR 93 | Ht 66.0 in | Wt 166.2 lb

## 2024-07-13 DIAGNOSIS — M5442 Lumbago with sciatica, left side: Secondary | ICD-10-CM | POA: Diagnosis not present

## 2024-07-13 DIAGNOSIS — M797 Fibromyalgia: Secondary | ICD-10-CM | POA: Insufficient documentation

## 2024-07-13 DIAGNOSIS — G8929 Other chronic pain: Secondary | ICD-10-CM | POA: Insufficient documentation

## 2024-07-13 DIAGNOSIS — M5441 Lumbago with sciatica, right side: Secondary | ICD-10-CM | POA: Insufficient documentation

## 2024-07-13 DIAGNOSIS — M357 Hypermobility syndrome: Secondary | ICD-10-CM | POA: Diagnosis not present

## 2024-07-13 MED ORDER — SUZETRIGINE 50 MG PO TABS
50.0000 mg | ORAL_TABLET | Freq: Two times a day (BID) | ORAL | 0 refills | Status: AC | PRN
Start: 2024-07-13 — End: ?

## 2024-07-13 MED ORDER — GABAPENTIN 800 MG PO TABS: 800.0000 mg | ORAL_TABLET | Freq: Four times a day (QID) | ORAL | 3 refills | Status: DC

## 2024-07-13 NOTE — Progress Notes (Signed)
 Subjective:    Patient ID: Rhonda [name], female    DOB: January 08, 2006, 18 y.o.   MRN: [id number]  HPI   HPI  Patient is here with her mother today  Rhonda [name] is a 33 y.o. year old female  who  has a past medical history of Anorexia nervosa, Anxiety, Depression, Psychogenic nonepileptic seizure, PTSD (post-traumatic stress disorder), Seasonal allergies, and Tourette's.   They are presenting to [facility] clinic as a new patient for pain management evaluation. They were referred by Manuelita Flatness for treatment of body wide pain.  Patient reports that her pain started about 3 to 4 years ago after she developed Tourette's and seizures.  It appears that seizures are felt to be nonepileptic.  She has reported pain throughout her whole body.  Pain appears to be worse in her neck, lower back, shoulders.  Pain will shoot down her legs.  Standing for a long time worsens her pain significantly.  She feels like she cannot do anything due to the pain.  If she tries to do any kind of strenuous activity she has to sit and take a break halfway through.  She feels that her pain has a tingling, nerve type sensation.  Patient has hypomobility, not diagnosed with Ehlers-Danlos but is interested in getting tested for this condition.  She is now on max dose gabapentin  and she feels like this is what is helping her pain best.  She also does stretching and heat that can be helpful.  Patient reports poor sleep at night, she is tired today due to this.  When she does any kind of significant activity she is often sore for the next few days.  She has a history of depression and ADHD.   Red flag symptoms: No red flags for back pain endorsed in Hx or ROS  Medications tried: Topical medications  solonpas, icyhot- dont help  Nsaids - do nothing  Tylenol   - doesn't do anything  Gabapentin - Its great, she in on max  TCAs- denies  SNRIs  She takes duloxetine   Heating pad helps   Other treatments: PT- 2 different places,  didn't help  TENs unit - Has not tried  Injections - Denies  Surgery Denies   Interval history 03/17/2024 Patient continues to have pain in multiple areas of her body, reported to be unchanged from prior visit.  She reports pain is worse recently and her neck, back, and legs.  Not affecting the arms quite as much.  She tried low-dose naltrexone 1.5 mg for several weeks, denies benefit.  Discussed that we would ideally titrate medication to see if other doses might be helpful.  She continues to take gabapentin  1600 mg twice daily.  Discussed this is typically above maximum for single dose.  We discussed trying Lyrica, patient and mother initially agreed but then decided against this medication.  They had heard from someone working with pharmacy that this does not cause sedation.  Discussed that this medication can also cause sedation like gabapentin , however it is possible she may be able to tolerate it better.  It may also be more helpful for her pain.  We would have to trial a medication to know for sure.  They report obtaining TENS unit but have not tried it yet.  Activity level continues to be very low.  Patient's mother asked about testing that would specifically diagnose fibromyalgia, discussed there is not specific test at this condition.  L-spine x-ray still pending. She would like to see genetics  for testing for EDS.   Interval history 07/13/24 Patient is here for follow-up of her chronic pain.  She is here with her mother today.  She still interested in trying Lyrica, wants to stay on gabapentin .  She feels like gabapentin  helps but does not last long enough.  She is on maximum dose of gabapentin  currently.  Provided number to call regarding her genetics consultation prior visit regarding hypermobility.  Patient reports she has had 2 seizures since last visit, she has a history of psychogenic seizures.  Has been following with psychiatry, started on new ADHD medication.  Patient stopped naltrexone,  did not think it was helping.  Patient feels like anterior pelvic tilt is contributing to her pain.   Prior UDS results:     Component Value Date/Time   LABOPIA NONE DETECTED 12/20/2021 1708   COCAINSCRNUR NONE DETECTED 12/20/2021 1708   LABBENZ NONE DETECTED 12/20/2021 1708   AMPHETMU NONE DETECTED 12/20/2021 1708   THCU NONE DETECTED 12/20/2021 1708   LABBARB NONE DETECTED 12/20/2021 1708       Pain Inventory Average Pain 7 Pain Right Now 2 My pain is constant and aching  LOCATION OF PAIN  Neck, Shoulder, Knees & toes, both legs  BOWEL Number of stools per week: 7   BLADDER Normal    Mobility walk without assistance ability to climb steps?  yes do you drive?  no Do you have any goals in this area?  yes  Function not employed: date last employed Not employed   Neuro/Psych depression  Prior Studies Any changes since last visit?  no  Physicians involved in your care Any changes since last visit?  no   Family History  Problem Relation Age of Onset   Anxiety disorder Mother    Eating disorder Mother    Asthma Father    Cancer Maternal Grandmother    COPD Maternal Grandmother    Migraines Neg Hx    Seizures Neg Hx    Depression Neg Hx    Schizophrenia Neg Hx    ADD / ADHD Neg Hx    Autism Neg Hx    Social History   Socioeconomic History   Marital status: Single    Spouse name: Not on file   Number of children: Not on file   Years of education: Not on file   Highest education level: Not on file  Occupational History   Not on file  Tobacco Use   Smoking status: Never    Passive exposure: Never   Smokeless tobacco: Never  Vaping Use   Vaping status: Never Used  Substance and Sexual Activity   Alcohol use: No   Drug use: Never   Sexual activity: Never  Other Topics Concern   Not on file  Social History Narrative   Rhonda [name] graduated form [employer].    She works as a TEFL teacher currently.    She lives with her parents and sibling and dog.     Rhonda [name] enjoys reading, listening to music, snuggling with dog ([name]), drawing and singing.    Social Drivers of Corporate investment banker Strain: Not on file  Food Insecurity: Not on file  Transportation Needs: Not on file  Physical Activity: Not on file  Stress: Not on file  Social Connections: Not on file   Past Surgical History:  Procedure Laterality Date   TOE SURGERY Bilateral    Past Medical History:  Diagnosis Date   Anorexia nervosa    Anxiety  [employer]rees[employer] 06/11/2020   Depression    [employer]rees[employer] 06/11/2020   Psychogenic nonepileptic seizure    PTSD (post-traumatic stress disorder)    Seasonal allergies    Tourette's    BP 114/83   Pulse 93   Ht 5' 6 (1.676 m)   Wt 166 lb 3.2 oz (75.4 kg)   SpO2 98%   BMI 26.83 kg/m   Opioid Risk Score:   Fall Risk Score:  `1  Depression screen PHQ 2/9     07/13/2024    2:30 PM 03/17/2024    1:09 PM 12/02/2023    1:41 PM 10/27/2023    3:43 PM 12/20/2021    9:38 PM 04/29/2020    9:00 AM 03/01/2020    1:44 PM  Depression screen PHQ 2/9  Decreased Interest 0 3 3 3 3 3 3   Down, Depressed, Hopeless 0 0 3 1 3 3 3   PHQ - 2 Score 0 3 6 4 6 6 6   Altered sleeping  2 3 3 3 3 3   Tired, decreased energy  3 3 3 3 3 3   Change in appetite  0 3 3 2 1  0  Feeling bad or failure about yourself   3 3 3 3 3 3   Trouble concentrating  0 1 2 3 3 3   Moving slowly or fidgety/restless  0 0 3 2 1 2   Suicidal thoughts  0 0 2 1    PHQ-9 Score  11 19 23 23 20 20   Difficult doing work/chores    Extremely dIfficult Extremely dIfficult      Review of Systems  Musculoskeletal:  Positive for arthralgias, back pain and neck pain.  Psychiatric/Behavioral:         Depression  All other systems reviewed and are negative.      Objective:   Physical Exam   Gen: no distress, normal appearing HEENT: oral mucosa pink and moist, NCAT Chest: normal effort, normal rate of breathing Abd: soft, non-distended Ext: no edema Psych: pleasant, normal  affect Skin: intact Neuro: Alert and awake, follows commands, cranial nerves II through XII grossly intact, normal speech and language RUE: 5/5 Deltoid, 5/5 Biceps, 5/5 Triceps, 5/5 Wrist Ext, 5/5 Grip LUE: 5/5 Deltoid, 5/5 Biceps, 5/5 Triceps, 5/5 Wrist Ext, 5/5 Grip RLE: HF 5/5, KE 5/5, ADF 5/5, APF 5/5 LLE: HF 5/5, KE 5/5, ADF 5/5, APF 5/5 Sensory exam normal for light touch and pain in all 4 limbs. No limb ataxia or cerebellar signs. No abnormal tone appreciated.  No abnormal tone noted Musculoskeletal:  Minimal TTP in her cervical, thoracic and lumbar spine today Mild TTP in periscapular muscles bilaterally TTP in her shoulders and left elbow Minimal tenderness bilateral greater trochanters Hypermobility noted several joints  Prior exam  No TTP paraspinal muscles b/l No TTP C spine, T spine and upper L spine She reports band of pain lower L spine  SLR negative b/l  FABER- hip pain b/l FADIR neg Spurlings test negative  Hypermobility/hyperextension at b/l knees, elbows, shoulders Able to touch thumbs to wrists B/L shoulder TTP, L elbow TTP  B/L hip TTP  B/L shoulder TTP Able to put her hands flat down on the ground bending forward  X-ray C-spine 08/04/2021  Straightening of the cervical spine. Vertebral body heights and disc spaces appear normal. Dens and lateral masses are unremarkable   IMPRESSION: Straightening of the cervical spine.  Otherwise negative         Assessment & Plan:   1) Chronic lower back  pain -She does reports shooting pain down her b/l legs 2) Chronic neck pain 3) Hypermobility syndrome  4) Depression/ADHD -No SI or HI  5)Polyarthralgia  -Suspect fibromyalgia as she has widespread pain, history of depression, insomnia 6) Insomnia   -Continue Cymbalta  current dose - Patient would like to remain on gabapentin , she is on 800 mg 4 times daily -Patient denies benefit with low-dose naltrexone, although appears she did not try higher doses than  1.5 mg daily.  She does not want to try this medication again at this time -Consider trying lyrica but would need to discontinue gabapentin .  Patient declines -Low impact gradual increased activity discussed -Recommend she try tai chi again today -Pool therapy may be limited by psychogenic seizures -Prior visit ordered Zynex Nexwave -X-ray of lumbar spine ordered last visit- pending. 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 Lorilee notified and patient will be scheduled for follow-up with this provider

## 2024-07-17 ENCOUNTER — Other Ambulatory Visit (HOSPITAL_COMMUNITY): Payer: Self-pay | Admitting: Psychiatry

## 2024-07-18 DIAGNOSIS — F445 Conversion disorder with seizures or convulsions: Secondary | ICD-10-CM | POA: Diagnosis not present

## 2024-07-18 DIAGNOSIS — F4310 Post-traumatic stress disorder, unspecified: Secondary | ICD-10-CM | POA: Diagnosis not present
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
## 2024-07-25 DIAGNOSIS — F445 Conversion disorder with seizures or convulsions: Secondary | ICD-10-CM | POA: Diagnosis not present

## 2024-07-25 DIAGNOSIS — F4310 Post-traumatic stress disorder, unspecified: Secondary | ICD-10-CM | POA: Diagnosis not present

## 2024-07-26 ENCOUNTER — Encounter (HOSPITAL_COMMUNITY): Payer: Self-pay | Admitting: Psychiatry

## 2024-07-26 ENCOUNTER — Telehealth (HOSPITAL_COMMUNITY): Admitting: Psychiatry

## 2024-07-26 DIAGNOSIS — F4310 Post-traumatic stress disorder, unspecified: Secondary | ICD-10-CM

## 2024-07-26 DIAGNOSIS — F50014 Anorexia nervosa, restricting type, in remission: Secondary | ICD-10-CM

## 2024-07-26 DIAGNOSIS — F411 Generalized anxiety disorder: Secondary | ICD-10-CM | POA: Diagnosis not present

## 2024-07-26 DIAGNOSIS — F3342 Major depressive disorder, recurrent, in full remission: Secondary | ICD-10-CM | POA: Diagnosis not present

## 2024-07-26 DIAGNOSIS — F900 Attention-deficit hyperactivity disorder, predominantly inattentive type: Secondary | ICD-10-CM

## 2024-07-26 MED ORDER — PROPRANOLOL HCL ER 60 MG PO CP24
60.0000 mg | ORAL_CAPSULE | Freq: Every day | ORAL | 2 refills | Status: DC
Start: 2024-07-26 — End: 2024-10-02

## 2024-07-26 MED ORDER — DULOXETINE HCL 60 MG PO CPEP: 60.0000 mg | ORAL_CAPSULE | Freq: Every day | ORAL | 2 refills | Status: DC

## 2024-07-26 MED ORDER — ATOMOXETINE HCL 60 MG PO CAPS: 60.0000 mg | ORAL_CAPSULE | Freq: Every day | ORAL | 2 refills | Status: DC

## 2024-07-26 NOTE — Patient Instructions (Signed)
 Thank you for attending your appointment today.  -- INCREASE Strattera  to 60 mg daily -- Continue other medications as prescribed.  Please do not make any changes to medications without first discussing with your provider. If you are experiencing a psychiatric emergency, please call 911 or present to your nearest emergency department. Additional crisis, medication management, and therapy resources are included below.  [facility]  [address], [place], [place] [address] [phone] WALK-IN URGENT CARE 24/7 FOR ANYONE [address] [place], [place]  [phone] Fax: [phone] [email or web address] *Interpreters available *Accepts all insurance and uninsured for Urgent Care needs *Accepts Medicaid and uninsured for outpatient treatment (below)      ONLY FOR [facility]  Below:    Outpatient New Patient Assessment/Therapy Walk-ins:        Monday, Wednesday, and Thursday 8am until slots are full (first come, first served)                   New Patient Psychiatry/Medication Management        Monday-Friday 8am-11am (first come, first served)               For all walk-ins we ask that you arrive by 7:15am, because patients will be seen in the order of arrival.

## 2024-08-01 DIAGNOSIS — F445 Conversion disorder with seizures or convulsions: Secondary | ICD-10-CM | POA: Diagnosis not present

## 2024-08-01 DIAGNOSIS — F4310 Post-traumatic stress disorder, unspecified: Secondary | ICD-10-CM | POA: Diagnosis not present

## 2024-08-08 DIAGNOSIS — F4310 Post-traumatic stress disorder, unspecified: Secondary | ICD-10-CM | POA: Diagnosis not present

## 2024-08-08 DIAGNOSIS — F445 Conversion disorder with seizures or convulsions: Secondary | ICD-10-CM | POA: Diagnosis not present

## 2024-08-15 DIAGNOSIS — F445 Conversion disorder with seizures or convulsions: Secondary | ICD-10-CM | POA: Diagnosis not present

## 2024-08-15 DIAGNOSIS — F4310 Post-traumatic stress disorder, unspecified: Secondary | ICD-10-CM | POA: Diagnosis not present

## 2024-08-22 ENCOUNTER — Other Ambulatory Visit (HOSPITAL_COMMUNITY): Payer: Self-pay | Admitting: Psychiatry

## 2024-08-22 DIAGNOSIS — F4310 Post-traumatic stress disorder, unspecified: Secondary | ICD-10-CM | POA: Diagnosis not present

## 2024-08-22 DIAGNOSIS — F445 Conversion disorder with seizures or convulsions: Secondary | ICD-10-CM | POA: Diagnosis not present

## 2024-08-29 DIAGNOSIS — F4310 Post-traumatic stress disorder, unspecified: Secondary | ICD-10-CM | POA: Diagnosis not present

## 2024-08-29 DIAGNOSIS — F445 Conversion disorder with seizures or convulsions: Secondary | ICD-10-CM | POA: Diagnosis not present

## 2024-08-31 ENCOUNTER — Encounter: Payer: Self-pay | Admitting: *Deleted

## 2024-09-07 DIAGNOSIS — F445 Conversion disorder with seizures or convulsions: Secondary | ICD-10-CM | POA: Diagnosis not present

## 2024-09-07 DIAGNOSIS — F4310 Post-traumatic stress disorder, unspecified: Secondary | ICD-10-CM | POA: Diagnosis not present

## 2024-09-12 DIAGNOSIS — F445 Conversion disorder with seizures or convulsions: Secondary | ICD-10-CM | POA: Diagnosis not present

## 2024-09-12 DIAGNOSIS — F4310 Post-traumatic stress disorder, unspecified: Secondary | ICD-10-CM | POA: Diagnosis not present

## 2024-09-19 DIAGNOSIS — F445 Conversion disorder with seizures or convulsions: Secondary | ICD-10-CM | POA: Diagnosis not present

## 2024-09-19 DIAGNOSIS — F4310 Post-traumatic stress disorder, unspecified: Secondary | ICD-10-CM | POA: Diagnosis not present

## 2024-09-23 ENCOUNTER — Other Ambulatory Visit (HOSPITAL_COMMUNITY): Payer: Self-pay | Admitting: Psychiatry
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
## 2024-10-02 ENCOUNTER — Telehealth (INDEPENDENT_AMBULATORY_CARE_PROVIDER_SITE_OTHER): Admitting: Psychiatry

## 2024-10-02 ENCOUNTER — Encounter (HOSPITAL_COMMUNITY): Payer: Self-pay | Admitting: Psychiatry

## 2024-10-02 DIAGNOSIS — F411 Generalized anxiety disorder: Secondary | ICD-10-CM

## 2024-10-02 DIAGNOSIS — F4310 Post-traumatic stress disorder, unspecified: Secondary | ICD-10-CM

## 2024-10-02 DIAGNOSIS — E559 Vitamin D deficiency, unspecified: Secondary | ICD-10-CM

## 2024-10-02 DIAGNOSIS — F3342 Major depressive disorder, recurrent, in full remission: Secondary | ICD-10-CM | POA: Diagnosis not present

## 2024-10-02 DIAGNOSIS — F50014 Anorexia nervosa, restricting type, in remission: Secondary | ICD-10-CM | POA: Diagnosis not present

## 2024-10-02 DIAGNOSIS — F900 Attention-deficit hyperactivity disorder, predominantly inattentive type: Secondary | ICD-10-CM | POA: Diagnosis not present

## 2024-10-02 MED ORDER — ATOMOXETINE HCL 80 MG PO CAPS: 80.0000 mg | ORAL_CAPSULE | Freq: Every day | ORAL | 2 refills | Status: DC

## 2024-10-02 MED ORDER — PROPRANOLOL HCL ER 60 MG PO CP24: 60.0000 mg | ORAL_CAPSULE | Freq: Every day | ORAL | 1 refills | Status: DC

## 2024-10-02 MED ORDER — DULOXETINE HCL 60 MG PO CPEP: 60.0000 mg | ORAL_CAPSULE | Freq: Every day | ORAL | 1 refills | Status: DC

## 2024-10-02 NOTE — Patient Instructions (Signed)
 Thank you for attending your appointment today.  -- INCREASE Strattera  to 80 mg daily -- Continue other medications as prescribed.  Please do not make any changes to medications without first discussing with your provider. If you are experiencing a psychiatric emergency, please call 911 or present to your nearest emergency department. Additional crisis, medication management, and therapy resources are included below.  [facility]  [address], [place], [place] [address] [phone] WALK-IN URGENT CARE 24/7 FOR ANYONE [address] [place], [place]  [phone] Fax: [phone] [email or web address] *Interpreters available *Accepts all insurance and uninsured for Urgent Care needs *Accepts Medicaid and uninsured for outpatient treatment (below)      ONLY FOR [facility]  Below:    Outpatient New Patient Assessment/Therapy Walk-ins:        Monday, Wednesday, and Thursday 8am until slots are full (first come, first served)                   New Patient Psychiatry/Medication Management        Monday-Friday 8am-11am (first come, first served)               For all walk-ins we ask that you arrive by 7:15am, because patients will be seen in the order of arrival.

## 2024-10-03 DIAGNOSIS — F445 Conversion disorder with seizures or convulsions: Secondary | ICD-10-CM | POA: Diagnosis not present

## 2024-10-03 DIAGNOSIS — F4310 Post-traumatic stress disorder, unspecified: Secondary | ICD-10-CM | POA: Diagnosis not present

## 2024-10-05 DIAGNOSIS — F4310 Post-traumatic stress disorder, unspecified: Secondary | ICD-10-CM | POA: Diagnosis not present

## 2024-10-05 DIAGNOSIS — F445 Conversion disorder with seizures or convulsions: Secondary | ICD-10-CM | POA: Diagnosis not present

## 2024-10-10 DIAGNOSIS — F4310 Post-traumatic stress disorder, unspecified: Secondary | ICD-10-CM | POA: Diagnosis not present

## 2024-10-10 DIAGNOSIS — F445 Conversion disorder with seizures or convulsions: Secondary | ICD-10-CM | POA: Diagnosis not present

## 2024-10-12 ENCOUNTER — Telehealth: Payer: Self-pay | Admitting: Physician Assistant

## 2024-10-12 NOTE — Telephone Encounter (Signed)
 Called and spoke with pt refarding scheduling a TOC with someone in our office. Pt states she will call back and schedule after looking at website.

## 2024-10-16 ENCOUNTER — Encounter: Attending: Physical Medicine & Rehabilitation | Admitting: Physical Medicine and Rehabilitation

## 2024-10-16 VITALS — BP 116/83 | HR 87 | Ht 66.0 in | Wt 169.0 lb

## 2024-10-16 DIAGNOSIS — F950 Transient tic disorder: Secondary | ICD-10-CM | POA: Insufficient documentation

## 2024-10-16 DIAGNOSIS — R569 Unspecified convulsions: Secondary | ICD-10-CM | POA: Diagnosis not present

## 2024-10-16 DIAGNOSIS — E559 Vitamin D deficiency, unspecified: Secondary | ICD-10-CM | POA: Diagnosis not present

## 2024-10-16 DIAGNOSIS — M797 Fibromyalgia: Secondary | ICD-10-CM | POA: Diagnosis not present

## 2024-10-16 DIAGNOSIS — G43809 Other migraine, not intractable, without status migrainosus: Secondary | ICD-10-CM | POA: Insufficient documentation

## 2024-10-16 NOTE — Progress Notes (Signed)
 Subjective:    Patient ID: Rhonda [name], female    DOB: [birth year], 18 y.o.   MRN: [id number]  HPI  Patient is here with her mother today  Rhonda [name] is a 18 y.o. year old female  who  has a past medical history of Anorexia nervosa (HCC), Anxiety, Depression, Psychogenic nonepileptic seizure, PTSD (post-traumatic stress disorder), Seasonal allergies, and Tourette's.   They are presenting to PM[facility] clinic as a new patient for pain management evaluation. They were referred by Rhonda [name] for treatment of body wide pain.  Patient reports that her pain started about 3 to 4 years ago after she developed Tourette's and seizures.  It appears that seizures are felt to be nonepileptic.  She has reported pain throughout her whole body.  Pain appears to be worse in her neck, lower back, shoulders.  Pain will shoot down her legs.  Standing for a long time worsens her pain significantly.  She feels like she cannot do anything due to the pain.  If she tries to do any kind of strenuous activity she has to sit and take a break halfway through.  She feels that her pain has a tingling, nerve type sensation.  Patient has hypomobility, not diagnosed with Ehlers-Danlos but is interested in getting tested for this condition.  She is now on max dose gabapentin  and she feels like this is what is helping her pain best.  She also does stretching and heat that can be helpful.  Patient reports poor sleep at night, she is tired today due to this.  When she does any kind of significant activity she is often sore for the next few days.  She has a history of depression and ADHD.  -She and her mother a looking for answers for her  -she has been seizure free for 4 months  Has consistent pain in her body for the past few years.   -mom does not like the term fibromyalgia as it feels like a blanket term  -she does not want to keep adding medications as she is on a high dose of gabapentin   -she is wondering if she has  Ehlers-Danlos syndrome  -her mom wants her to not be in such a constant state of chronic pain  -she had to stop going to school because she was having seizures every day   Red flag symptoms: No red flags for back pain endorsed in Hx or ROS  Medications tried: Topical medications  solonpas, icyhot- dont help  Nsaids - do nothing  Tylenol   - doesn't do anything  Gabapentin - Its great, she in on max  TCAs- denies  SNRIs  She takes duloxetine   Heating pad helps   Other treatments: PT- 2 different places, didn't help  TENs unit - Has not tried  Injections - Denies  Surgery Denies   Interval history 03/17/2024 Patient continues to have pain in multiple areas of her body, reported to be unchanged from prior visit.  She reports pain is worse recently and her neck, back, and legs.  Not affecting the arms quite as much.  She tried low-dose naltrexone 1.5 mg for several weeks, denies benefit.  Discussed that we would ideally titrate medication to see if other doses might be helpful.  She continues to take gabapentin  1600 mg twice daily.  Discussed this is typically above maximum for single dose.  We discussed trying Lyrica, patient and mother initially agreed but then decided against this medication.  They had heard from someone working  with pharmacy that this does not cause sedation.  Discussed that this medication can also cause sedation like gabapentin , however it is possible she may be able to tolerate it better.  It may also be more helpful for her pain.  We would have to trial a medication to know for sure.  They report obtaining TENS unit but have not tried it yet.  Activity level continues to be very low.  Patient's mother asked about testing that would specifically diagnose fibromyalgia, discussed there is not specific test at this condition.  L-spine x-ray still pending. She would like to see genetics for testing for EDS.   Interval history 07/13/24 Patient is here for follow-up of her  chronic pain.  She is here with her mother today.  She still interested in trying Lyrica, wants to stay on gabapentin .  She feels like gabapentin  helps but does not last long enough.  She is on maximum dose of gabapentin  currently.  Provided number to call regarding her genetics consultation prior visit regarding hypermobility.  Patient reports she has had 2 seizures since last visit, she has a history of psychogenic seizures.  Has been following with psychiatry, started on new ADHD medication.  Patient stopped naltrexone, did not think it was helping.  Patient feels like anterior pelvic tilt is contributing to her pain.   Prior UDS results:     Component Value Date/Time   LABOPIA NONE DETECTED 12/20/2021 1708   COCAINSCRNUR NONE DETECTED 12/20/2021 1708   LABBENZ NONE DETECTED 12/20/2021 1708   AMPHETMU NONE DETECTED 12/20/2021 1708   THCU NONE DETECTED 12/20/2021 1708   LABBARB NONE DETECTED 12/20/2021 1708       Pain Inventory Average Pain 5 Pain Right Now 2 My pain is constant, sharp, burning, dull, stabbing, and aching  In last 24 hours, has pain interfered with following? General activity 7 Relation with others 2 Enjoyment of life 5  What time of day is pain worse? Night Sleep fair Pain is worse with: walking, bending, standing Pain improves with: rest, heat/ice, medication Relief from meds: 6   Family History  Problem Relation Age of Onset   Anxiety disorder Mother    Eating disorder Mother    Asthma Father    Cancer Maternal Grandmother    COPD Maternal Grandmother    Migraines Neg Hx    Seizures Neg Hx    Depression Neg Hx    Schizophrenia Neg Hx    ADD / ADHD Neg Hx    Autism Neg Hx    Social History   Socioeconomic History   Marital status: Single    Spouse name: Not on file   Number of children: Not on file   Years of education: Not on file   Highest education level: Not on file  Occupational History   Not on file  Tobacco Use   Smoking status:  Never    Passive exposure: Never   Smokeless tobacco: Never  Vaping Use   Vaping status: Never Used  Substance and Sexual Activity   Alcohol use: No   Drug use: Never   Sexual activity: Never  Other Topics Concern   Not on file  Social History Narrative   Keshia graduated form [employer].    She works as a [employer] teacher currently.    She lives with her parents and sibling and dog.    Jaclyne enjoys reading, listening to music, snuggling with dog ([name]), drawing and singing.    Social Drivers of Health  Financial Resource Strain: Not on file  Food Insecurity: Not on file  Transportation Needs: Not on file  Physical Activity: Not on file  Stress: Not on file  Social Connections: Not on file   Past Surgical History:  Procedure Laterality Date   TOE SURGERY Bilateral    Past Medical History:  Diagnosis Date   Anorexia nervosa (HCC)    Anxiety    [employer] 06/11/2020   Depression    [employer] 06/11/2020   Psychogenic nonepileptic seizure    PTSD (post-traumatic stress disorder)    Seasonal allergies    Tourette's    Wt 169 lb (76.7 kg)   BMI 27.28 kg/m   Opioid Risk Score:   Fall Risk Score:  `1  Depression screen PHQ 2/9     07/13/2024    2:30 PM 03/17/2024    1:09 PM 12/02/2023    1:41 PM 10/27/2023    3:43 PM 12/20/2021    9:38 PM 04/29/2020    9:00 AM 03/01/2020    1:44 PM  Depression screen PHQ 2/9  Decreased Interest 0 3 3 3 3 3 3   Down, Depressed, Hopeless 0 0 3 1 3 3 3   PHQ - 2 Score 0 3 6 4 6 6 6   Altered sleeping  2 3 3 3 3 3   Tired, decreased energy  3 3 3 3 3 3   Change in appetite  0 3 3 2 1  0  Feeling bad or failure about yourself   3 3 3 3 3 3   Trouble concentrating  0 1 2 3 3 3   Moving slowly or fidgety/restless  0 0 3 2 1 2   Suicidal thoughts  0 0 2 1    PHQ-9 Score  11 19 23 23 20 20   Difficult doing work/chores    Extremely dIfficult Extremely dIfficult      Review of Systems  Musculoskeletal:  Positive for arthralgias, back pain and neck  pain.  Psychiatric/Behavioral:         Depression  All other systems reviewed and are negative.      Objective:   Physical Exam   Gen: no distress, normal appearing HEENT: oral mucosa pink and moist, NCAT Chest: normal effort, normal rate of breathing Abd: soft, non-distended Ext: no edema Psych: pleasant, normal affect Skin: intact Neuro: Alert and awake, follows commands, cranial nerves II through XII grossly intact, normal speech and language RUE: 5/5 Deltoid, 5/5 Biceps, 5/5 Triceps, 5/5 Wrist Ext, 5/5 Grip LUE: 5/5 Deltoid, 5/5 Biceps, 5/5 Triceps, 5/5 Wrist Ext, 5/5 Grip RLE: HF 5/5, KE 5/5, ADF 5/5, APF 5/5 LLE: HF 5/5, KE 5/5, ADF 5/5, APF 5/5 Sensory exam normal for light touch and pain in all 4 limbs. No limb ataxia or cerebellar signs. No abnormal tone appreciated.  No abnormal tone noted Musculoskeletal:  Minimal TTP in her cervical, thoracic and lumbar spine today Mild TTP in periscapular muscles bilaterally TTP in her shoulders and left elbow Minimal tenderness bilateral greater trochanters Hypermobility noted several joints  No TTP paraspinal muscles b/l No TTP C spine, T spine and upper L spine She reports band of pain lower L spine  SLR negative b/l  FABER- hip pain b/l FADIR neg Spurlings test negative  Hypermobility/hyperextension at b/l knees, elbows, shoulders Able to touch thumbs to wrists B/L shoulder TTP, L elbow TTP  B/L hip TTP  B/L shoulder TTP Able to put her hands flat down on the ground bending forward Stable 10/27  X-ray C-spine 08/04/2021  Straightening of the cervical spine. Vertebral body heights and disc spaces appear normal. Dens and lateral masses are unremarkable   IMPRESSION: Straightening of the cervical spine.  Otherwise negative         Assessment & Plan:   1) Chronic lower back pain -She does reports shooting pain down her b/l legs 2) Chronic neck pain 3) Hypermobility syndrome  4) Depression/ADHD -No SI or HI   5)Polyarthralgia  -Suspect fibromyalgia as she has widespread pain, history of depression, insomnia  6) Insomnia  -Continue Cymbalta  current dose - Patient would like to remain on gabapentin , she is on 800 mg 4 times daily -Patient denies benefit with low-dose naltrexone, although appears she did not try higher doses than 1.5 mg daily.  She does not want to try this medication again at this time -Consider trying lyrica but would need to discontinue gabapentin .  Patient declines -Low impact gradual increased activity discussed -Recommend she try tai chi again today -Pool therapy may be limited by psychogenic seizures -Prior visit ordered Zynex Nexwave -X-ray of lumbar spine ordered last visit- pending. 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 Lorilee notified and patient will be scheduled for follow-up with this provider  7) Fibromyalgia: -discussed that her father did not believe her -discussed possible Elbert Shine virus  -discussed that the gabapentin  and cymbalta   -recommended eating fruits and vegetable -discussed that she has been having diffuse joint pain- discussed that putting in pressure   8) Seizures: -discussed that she has been 4 months seizure free since her parents separated   9) Tics:  -discussed that these have improved -discussed that when the tics started her pain improved -discussed that her head would be improved with stress -discussed that she has not made any dietary changes  10) History of eating disorder: -discussed that she had to be hospitalized in the past  11) History of migraines and vertigo: -discussed staterra helps  12) Insomnia: -discussed that she does not sleep the greatest at night -discussed that sleep is an off  on thing for her -discussed that the best way for her not to wake up at pain -discussed that she is not a big tea person  12) Impaired energy -recommended NAC and liposomal glutathione  12) Vitamin D  insufficiency: -recommended restarting D3 daily

## 2024-10-16 NOTE — Patient Instructions (Addendum)
 Recommended avoiding gluten  Natto  nattokinase  Foods that may reduce pain: 1) Ginger (especially studied for arthritis)- reduce leukotriene production to decrease inflammation 2) Blueberries- high in phytonutrients that decrease inflammation 3) Salmon- marine omega-3s reduce joint swelling and pain 4) Pumpkin seeds- reduce inflammation 5) dark chocolate- reduces inflammation 6) turmeric- reduces inflammation 7) tart cherries - reduce pain and stiffness 8) extra virgin olive oil - its compound olecanthal helps to block prostaglandins  9) chili peppers- can be eaten or applied topically via capsaicin 10) mint- helpful for headache, muscle aches, joint pain, and itching 11) garlic- reduces inflammation 12) Green tea- reduces inflammation and oxidative stress, helps with weight loss, may reduce the risk of cancer, recommend Double Green Matcha [place] daily  Link to further information on diet for chronic pain: [email or web address]

## 2024-10-17 DIAGNOSIS — F445 Conversion disorder with seizures or convulsions: Secondary | ICD-10-CM | POA: Diagnosis not present

## 2024-10-17 DIAGNOSIS — F4310 Post-traumatic stress disorder, unspecified: Secondary | ICD-10-CM | POA: Diagnosis not present

## 2024-10-23 ENCOUNTER — Other Ambulatory Visit (HOSPITAL_COMMUNITY)

## 2024-10-24 DIAGNOSIS — F445 Conversion disorder with seizures or convulsions: Secondary | ICD-10-CM | POA: Diagnosis not present

## 2024-10-24 DIAGNOSIS — F4310 Post-traumatic stress disorder, unspecified: Secondary | ICD-10-CM | POA: Diagnosis not present

## 2024-10-25 ENCOUNTER — Other Ambulatory Visit (HOSPITAL_COMMUNITY): Payer: Self-pay | Admitting: Psychiatry

## 2024-10-31 ENCOUNTER — Encounter (HOSPITAL_COMMUNITY): Admitting: Psychiatry

## 2024-10-31 DIAGNOSIS — F4310 Post-traumatic stress disorder, unspecified: Secondary | ICD-10-CM | POA: Diagnosis not present

## 2024-10-31 DIAGNOSIS — F445 Conversion disorder with seizures or convulsions: Secondary | ICD-10-CM | POA: Diagnosis not present

## 2024-11-01 ENCOUNTER — Other Ambulatory Visit: Payer: Self-pay | Admitting: Physical Medicine & Rehabilitation

## 2024-11-01 DIAGNOSIS — M797 Fibromyalgia: Secondary | ICD-10-CM

## 2024-11-02 NOTE — Telephone Encounter (Signed)
 Pt calling to let us  know that her rx for gabapentin  (NEURONTIN ) 800 MG tablet was incorrect and needs to be adjusted to 2 800 mg tablets in the am, 2 at night. Pt states that the original rx was for 1 in am, 1 at night but she did not realize this until she ran out of the medication.

## 2024-11-07 ENCOUNTER — Other Ambulatory Visit: Payer: Self-pay | Admitting: Physical Medicine and Rehabilitation

## 2024-11-07 DIAGNOSIS — F4310 Post-traumatic stress disorder, unspecified: Secondary | ICD-10-CM | POA: Diagnosis not present

## 2024-11-07 DIAGNOSIS — F445 Conversion disorder with seizures or convulsions: Secondary | ICD-10-CM | POA: Diagnosis not present

## 2024-11-07 DIAGNOSIS — M797 Fibromyalgia: Secondary | ICD-10-CM

## 2024-11-07 MED ORDER — GABAPENTIN 800 MG PO TABS: 1600.0000 mg | ORAL_TABLET | Freq: Two times a day (BID) | ORAL | 3 refills | Status: DC

## 2024-11-07 NOTE — Telephone Encounter (Signed)
 Rhonda [name] mother called. Requesting an new rx for Gabapentin  to sent with updated instructions. Call back phone [phone]. Per mother she no longer sees Dr. Urbano.    This is what she states it should read: TAKING 2 TABS TWICE A DAY - PLEASE CONFIRM AND SEND NEW RX WITH CORRECT DIRECTIONS

## 2024-11-07 NOTE — Telephone Encounter (Signed)
 Patient mother stated Yariana will be seeing Dr. Olla. So, Dr. Lorilee  will you please send a new script to the pharmacy? With the new directions.

## 2024-11-08 LAB — EHLERS-DANLOS SYNDROME PANEL

## 2024-11-11 ENCOUNTER — Ambulatory Visit: Payer: Self-pay | Admitting: Physical Medicine and Rehabilitation

## 2024-11-14 ENCOUNTER — Telehealth (INDEPENDENT_AMBULATORY_CARE_PROVIDER_SITE_OTHER): Admitting: Psychiatry

## 2024-11-14 ENCOUNTER — Encounter (HOSPITAL_COMMUNITY): Payer: Self-pay | Admitting: Psychiatry

## 2024-11-14 ENCOUNTER — Other Ambulatory Visit (HOSPITAL_COMMUNITY): Payer: Self-pay | Admitting: Psychiatry

## 2024-11-14 DIAGNOSIS — F445 Conversion disorder with seizures or convulsions: Secondary | ICD-10-CM | POA: Diagnosis not present

## 2024-11-14 DIAGNOSIS — M797 Fibromyalgia: Secondary | ICD-10-CM | POA: Diagnosis not present

## 2024-11-14 DIAGNOSIS — F321 Major depressive disorder, single episode, moderate: Secondary | ICD-10-CM | POA: Diagnosis not present

## 2024-11-14 DIAGNOSIS — F411 Generalized anxiety disorder: Secondary | ICD-10-CM

## 2024-11-14 DIAGNOSIS — F900 Attention-deficit hyperactivity disorder, predominantly inattentive type: Secondary | ICD-10-CM

## 2024-11-14 DIAGNOSIS — F4310 Post-traumatic stress disorder, unspecified: Secondary | ICD-10-CM | POA: Diagnosis not present

## 2024-11-14 MED ORDER — DULOXETINE HCL 60 MG PO CPEP: 60.0000 mg | ORAL_CAPSULE | Freq: Every day | ORAL | 1 refills | Status: AC

## 2024-11-14 MED ORDER — VITAMIN D (CHOLECALCIFEROL) 25 MCG (1000 UT) PO CAPS: 1000.0000 [IU] | ORAL_CAPSULE | Freq: Every day | ORAL | 1 refills | Status: AC

## 2024-11-14 MED ORDER — PROPRANOLOL HCL ER 60 MG PO CP24
60.0000 mg | ORAL_CAPSULE | Freq: Every day | ORAL | 1 refills | Status: AC
Start: 2024-11-14 — End: 2025-05-13

## 2024-11-14 MED ORDER — ATOMOXETINE HCL 80 MG PO CAPS: 80.0000 mg | ORAL_CAPSULE | Freq: Every day | ORAL | 1 refills | Status: AC
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
## 2024-11-21 DIAGNOSIS — F4310 Post-traumatic stress disorder, unspecified: Secondary | ICD-10-CM | POA: Diagnosis not present

## 2024-11-21 DIAGNOSIS — F445 Conversion disorder with seizures or convulsions: Secondary | ICD-10-CM | POA: Diagnosis not present

## 2024-11-23 DIAGNOSIS — F4310 Post-traumatic stress disorder, unspecified: Secondary | ICD-10-CM | POA: Diagnosis not present

## 2024-11-23 DIAGNOSIS — F445 Conversion disorder with seizures or convulsions: Secondary | ICD-10-CM | POA: Diagnosis not present

## 2024-11-28 DIAGNOSIS — F4310 Post-traumatic stress disorder, unspecified: Secondary | ICD-10-CM | POA: Diagnosis not present

## 2024-11-28 DIAGNOSIS — F445 Conversion disorder with seizures or convulsions: Secondary | ICD-10-CM | POA: Diagnosis not present

## 2024-12-05 DIAGNOSIS — F445 Conversion disorder with seizures or convulsions: Secondary | ICD-10-CM | POA: Diagnosis not present

## 2024-12-05 DIAGNOSIS — F4310 Post-traumatic stress disorder, unspecified: Secondary | ICD-10-CM | POA: Diagnosis not present

## 2024-12-12 ENCOUNTER — Other Ambulatory Visit: Payer: Self-pay | Admitting: Physical Medicine and Rehabilitation

## 2024-12-12 DIAGNOSIS — F445 Conversion disorder with seizures or convulsions: Secondary | ICD-10-CM | POA: Diagnosis not present

## 2024-12-12 DIAGNOSIS — F4310 Post-traumatic stress disorder, unspecified: Secondary | ICD-10-CM | POA: Diagnosis not present

## 2024-12-12 DIAGNOSIS — M797 Fibromyalgia: Secondary | ICD-10-CM

## 2024-12-12 MED ORDER — GABAPENTIN 800 MG PO TABS: 1600.0000 mg | ORAL_TABLET | Freq: Two times a day (BID) | ORAL | 3 refills | Status: AC

## 2024-12-19 DIAGNOSIS — F4310 Post-traumatic stress disorder, unspecified: Secondary | ICD-10-CM | POA: Diagnosis not present

## 2024-12-19 DIAGNOSIS — F445 Conversion disorder with seizures or convulsions: Secondary | ICD-10-CM | POA: Diagnosis not present
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
## 2025-01-08 ENCOUNTER — Ambulatory Visit (HOSPITAL_BASED_OUTPATIENT_CLINIC_OR_DEPARTMENT_OTHER): Admitting: Physical Therapy

## 2025-01-08 NOTE — Therapy (Incomplete)
 " OUTPATIENT PHYSICAL THERAPY LOWER EXTREMITY EVALUATION   Patient Name: Rhonda [name] MRN: [id number] DOB:27-May-2006, 19 y.o., female Today's Date: 01/08/2025  END OF SESSION:   Past Medical History:  Diagnosis Date   Anorexia nervosa (HCC)    Anxiety    Phreesia 06/11/2020   Depression    Phreesia 06/11/2020   Psychogenic nonepileptic seizure    PTSD (post-traumatic stress disorder)    Seasonal allergies    Tourette's    Past Surgical History:  Procedure Laterality Date   TOE SURGERY Bilateral    Patient Active Problem List   Diagnosis Date Noted   ADHD (attention deficit hyperactivity disorder), inattentive type 04/19/2024   PTSD (post-traumatic stress disorder) 01/12/2024   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   Chronic back pain 10/27/2023   MDD (major depressive disorder), recurrent, in full remission 12/21/2021   Current severe episode of major depressive disorder without psychotic features without prior episode (HCC) 10/13/2021   Psychogenic nonepileptic seizure 10/13/2021   Tics of organic origin 03/25/2021   Generalized anxiety disorder 03/25/2021   Feeling tired 03/25/2021   Mild malnutrition 01/05/2019   Secondary amenorrhea 11/21/2018   Slow transit constipation 11/21/2018   Bradycardia 11/21/2018   Episodic tension type headache    Mild anorexia nervosa, restricting type, in full remission (HCC) 10/27/2018    PCP: Manuelita Flatness PA-C  REFERRING PROVIDER: Lorilee Sven SQUIBB, MD   REFERRING DIAG: M79.7 (ICD-10-CM) - Fibromyalgia   THERAPY DIAG:  No diagnosis found.  Rationale for Evaluation and Treatment: Rehabilitation  ONSET DATE: ***  SUBJECTIVE:   SUBJECTIVE STATEMENT: ***  PERTINENT HISTORY: Anorexia nervosa (HCC), Anxiety, Depression, Psychogenic nonepileptic seizure, PTSD (post-traumatic stress disorder), Seasonal allergies, and Tourette's  Tics 10/25: 1) Chronic lower back pain -She does reports shooting pain down her b/l legs 2)  Chronic neck pain 3) Hypermobility syndrome  4) Depression/ADHD -No SI or HI  5)Polyarthralgia PAIN:  Are you having pain? Yes: NPRS scale: *** Pain location: *** Pain description: *** Aggravating factors: *** Relieving factors: ***  PRECAUTIONS: {Therapy precautions:24002}  RED FLAGS: {PT Red Flags:29287}   WEIGHT BEARING RESTRICTIONS: {Yes ***/No:24003}  FALLS:  Has patient fallen in last 6 months? {fallsyesno:27318}  LIVING ENVIRONMENT: Lives with: {OPRC lives with:25569::lives with their family} Lives in: {Lives in:25570} Stairs: {opstairs:27293} Has following equipment at home: {Assistive devices:23999}  OCCUPATION: ***  PLOF: {PLOF:24004}  PATIENT GOALS: ***  NEXT MD VISIT: ***  OBJECTIVE:  Note: Objective measures were completed at Evaluation unless otherwise noted.  DIAGNOSTIC FINDINGS: X-ray C-spine 2022: IMPRESSION: Straightening of the cervical spine.  Otherwise negative  PATIENT SURVEYS:  {rehab surveys:24030}  COGNITION: Overall cognitive status: {cognition:24006}     SENSATION: {sensation:27233}  EDEMA:  {edema:24020}  MUSCLE LENGTH: Hamstrings: Right *** deg; Left *** deg Debby test: Right *** deg; Left *** deg  POSTURE: {posture:25561} Hypermobility/hyperextension at b/l knees, elbows, shoulders Able to touch thumbs to wrists  PALPATION: TTP throughout entire spine/paraspinals periscapular  LOWER EXTREMITY ROM:  {AROM/PROM:27142} ROM Right eval Left eval  Hip flexion    Hip extension    Hip abduction    Hip adduction    Hip internal rotation    Hip external rotation    Knee flexion    Knee extension    Ankle dorsiflexion    Ankle plantarflexion    Ankle inversion    Ankle eversion     (Blank rows = not tested)  LOWER EXTREMITY MMT:  MMT Right eval Left eval  Hip flexion    Hip  extension    Hip abduction    Hip adduction    Hip internal rotation    Hip external rotation    Knee flexion    Knee  extension    Ankle dorsiflexion    Ankle plantarflexion    Ankle inversion    Ankle eversion     (Blank rows = not tested)  SPECIAL TESTS:  SLR negative b/l  FABER- hip pain b/l FADIR neg Spurlings test negative   FUNCTIONAL TESTS:  {Functional tests:24029}  GAIT: Distance walked: *** Assistive device utilized: {Assistive devices:23999} Level of assistance: {Levels of assistance:24026} Comments: ***                                                                                                                                TREATMENT DATE: ***    PATIENT EDUCATION:  Education details: *** Person educated: {Person educated:25204} Education method: {Education Method:25205} Education comprehension: {Education Comprehension:25206}  HOME EXERCISE PROGRAM: ***  ASSESSMENT:  CLINICAL IMPRESSION: Patient is a *** y.o. *** who was seen today for physical therapy evaluation and treatment for ***.   OBJECTIVE IMPAIRMENTS: {opptimpairments:25111}.   ACTIVITY LIMITATIONS: {activitylimitations:27494}  PARTICIPATION LIMITATIONS: {participationrestrictions:25113}  PERSONAL FACTORS: {Personal factors:25162} are also affecting patient's functional outcome.   REHAB POTENTIAL: {rehabpotential:25112}  CLINICAL DECISION MAKING: {clinical decision making:25114}  EVALUATION COMPLEXITY: {Evaluation complexity:25115}   GOALS: Goals reviewed with patient? {yes/no:20286}  SHORT TERM GOALS: Target date: *** *** Baseline: Goal status: INITIAL  2.  *** Baseline:  Goal status: INITIAL  3.  *** Baseline:  Goal status: INITIAL  4.  *** Baseline:  Goal status: INITIAL  5.  *** Baseline:  Goal status: INITIAL  6.  *** Baseline:  Goal status: INITIAL  LONG TERM GOALS: Target date: ***  *** Baseline:  Goal status: INITIAL  2.  *** Baseline:  Goal status: INITIAL  3.  *** Baseline:  Goal status: INITIAL  4.  *** Baseline:  Goal status: INITIAL  5.   *** Baseline:  Goal status: INITIAL  6.  *** Baseline:  Goal status: INITIAL   PLAN:  PT FREQUENCY: {rehab frequency:25116}  PT DURATION: {rehab duration:25117}  PLANNED INTERVENTIONS: {rehab planned interventions:25118::97110-Therapeutic exercises,97530- Therapeutic [phone]- Neuromuscular re-education,97535- Self Rjmz,02859- Manual therapy,Patient/Family education}  PLAN FOR NEXT SESSION: PIERRETTE Matilda Kohut, PT 01/08/2025, 6:54 AM  "

## 2025-01-11 ENCOUNTER — Encounter (HOSPITAL_COMMUNITY): Admitting: Psychiatry

## 2025-01-11 DIAGNOSIS — F321 Major depressive disorder, single episode, moderate: Secondary | ICD-10-CM

## 2025-01-11 DIAGNOSIS — F4310 Post-traumatic stress disorder, unspecified: Secondary | ICD-10-CM

## 2025-01-11 DIAGNOSIS — F411 Generalized anxiety disorder: Secondary | ICD-10-CM

## 2025-01-11 DIAGNOSIS — F50014 Anorexia nervosa, restricting type, in remission: Secondary | ICD-10-CM

## 2025-01-11 DIAGNOSIS — F900 Attention-deficit hyperactivity disorder, predominantly inattentive type: Secondary | ICD-10-CM

## 2025-01-11 DIAGNOSIS — E559 Vitamin D deficiency, unspecified: Secondary | ICD-10-CM

## 2025-01-16 ENCOUNTER — Encounter: Attending: Physical Medicine and Rehabilitation | Admitting: Physical Medicine and Rehabilitation

## 2025-01-19 ENCOUNTER — Ambulatory Visit (HOSPITAL_BASED_OUTPATIENT_CLINIC_OR_DEPARTMENT_OTHER): Admitting: Physical Therapy

## 2025-01-26 ENCOUNTER — Ambulatory Visit (HOSPITAL_BASED_OUTPATIENT_CLINIC_OR_DEPARTMENT_OTHER): Admitting: Physical Therapy

## 2025-02-02 ENCOUNTER — Ambulatory Visit (HOSPITAL_BASED_OUTPATIENT_CLINIC_OR_DEPARTMENT_OTHER): Admitting: Physical Therapy
